# Patient Record
Sex: Female | Born: 2005 | Hispanic: No | Marital: Single | Smoking: Never smoker
Health system: Southern US, Community
[De-identification: ages and names within clinical notes are randomized; demographics above are authoritative.]

## PROBLEM LIST (undated history)

## (undated) ENCOUNTER — Ambulatory Visit: Admission: EM

---
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
## 2023-01-06 DIAGNOSIS — Z113 Encounter for screening for infections with a predominantly sexual mode of transmission: Secondary | ICD-10-CM | POA: Diagnosis not present

## 2023-04-29 DIAGNOSIS — F4323 Adjustment disorder with mixed anxiety and depressed mood: Secondary | ICD-10-CM | POA: Diagnosis not present

## 2023-05-06 DIAGNOSIS — F4323 Adjustment disorder with mixed anxiety and depressed mood: Secondary | ICD-10-CM | POA: Diagnosis not present

## 2023-05-16 DIAGNOSIS — F4323 Adjustment disorder with mixed anxiety and depressed mood: Secondary | ICD-10-CM | POA: Diagnosis not present

## 2023-05-30 DIAGNOSIS — F4323 Adjustment disorder with mixed anxiety and depressed mood: Secondary | ICD-10-CM | POA: Diagnosis not present

## 2023-06-13 DIAGNOSIS — F4323 Adjustment disorder with mixed anxiety and depressed mood: Secondary | ICD-10-CM | POA: Diagnosis not present

## 2023-06-27 DIAGNOSIS — F4323 Adjustment disorder with mixed anxiety and depressed mood: Secondary | ICD-10-CM | POA: Diagnosis not present

## 2023-07-07 ENCOUNTER — Ambulatory Visit
Admission: EM | Admit: 2023-07-07 | Discharge: 2023-07-07 | Disposition: A | Discharge status: Home / Self Care | Attending: Nurse Practitioner | Admitting: Nurse Practitioner

## 2023-07-07 DIAGNOSIS — R600 Localized edema: Secondary | ICD-10-CM | POA: Diagnosis not present

## 2023-07-07 MED ORDER — AMOXICILLIN-POT CLAVULANATE 875-125 MG PO TABS: 1.0000 | ORAL_TABLET | Freq: Two times a day (BID) | ORAL | 0 refills | Status: AC

## 2023-07-07 MED ORDER — PREDNISONE 20 MG PO TABS: 40.0000 mg | ORAL_TABLET | Freq: Every day | ORAL | 0 refills | Status: AC

## 2023-07-07 NOTE — ED Triage Notes (Signed)
"  Starting Friday with skin underneath my eyes swollen, red, itchy, tight". "I have been using the same stuff I have been using for months now, so not sure wheat it is from". "It isn't my eyes, just the skin below".

## 2023-07-07 NOTE — ED Provider Notes (Signed)
[facility]    CSN: [id number] Arrival date & time: 07/07/23  1740      History   Chief Complaint Chief Complaint  Patient presents with   Skin Problem    HPI Felicia Chang is a 18 y.o. female.   Felicia Chang is a 19 year old female who presents with redness and swelling of the skin just below both eyes, with symptom onset approximately three days ago. She describes the affected areas as itchy but denies any pain. There is no involvement of the eyes themselves-no eye pain, redness, swelling, or visual disturbances. She has not experienced fever, recent illness, headache, pain with eye movement, or neck stiffness. The patient wears both glasses and contact lenses but admits to not wearing them consistently; she has recently been using her glasses and has not worn her contacts in some time. She denies any trauma to the eyes or surrounding areas. Although she uses makeup intermittently, she has not changed or introduced any new facial products. She applied hydrocortisone cream to the affected areas without improvement.  The following portions of the patient's history were reviewed and updated as appropriate: allergies, current medications, past family history, past medical history, past social history, past surgical history, and problem list. proptosis, restricted eye movement, or vision changes.          Past Medical History:  Diagnosis Date   Asthma     Patient Active Problem List   Diagnosis Date Noted   Adolescent idiopathic scoliosis of thoracolumbar region 11/20/2021   Low back pain 11/20/2021    History reviewed. No pertinent surgical history.  OB History   No obstetric history on file.      Home Medications    Prior to Admission medications   Medication Sig Start Date End Date Taking? Authorizing Provider  amoxicillin -clavulanate (AUGMENTIN ) 875-125 MG tablet Take 1 tablet by mouth every 12 (twelve) hours. 07/07/23  Yes Tyner Codner, FNP   predniSONE  (DELTASONE ) 20 MG tablet Take 2 tablets (40 mg total) by mouth daily for 5 days. 07/07/23 07/12/23 Yes Maryruth Sol, FNP    Family History History reviewed. No pertinent family history.  Social History Social History   Tobacco Use   Smoking status: Never   Smokeless tobacco: Never  Vaping Use   Vaping status: Former   Substances: Nicotine, Flavoring  Substance Use Topics   Alcohol use: No   Drug use: No     Allergies   Patient has no known allergies.   Review of Systems Review of Systems  Constitutional:  Negative for fatigue and fever.  HENT: Negative.    Eyes:  Negative for photophobia, pain, discharge, redness, itching and visual disturbance.  Musculoskeletal:  Negative for myalgias, neck pain and neck stiffness.  Skin:  Negative for wound.  Neurological:  Negative for dizziness and headaches.  All other systems reviewed and are negative.    Physical Exam Triage Vital Signs ED Triage Vitals  Encounter Vitals Group     BP 07/07/23 1841 100/61     Systolic BP Percentile --      Diastolic BP Percentile --      Pulse Rate 07/07/23 1841 62     Resp 07/07/23 1841 18     Temp 07/07/23 1841 98.2 F (36.8 C)     Temp Source 07/07/23 1841 Oral     SpO2 07/07/23 1841 97 %     Weight 07/07/23 1838 115 lb (52.2 kg)     Height 07/07/23 1838 5'  2" (1.575 m)     Head Circumference --      Peak Flow --      Pain Score 07/07/23 1838 1     Pain Loc --      Pain Education --      Exclude from Growth Chart --    No data found.  Updated Vital Signs BP 100/61 (BP Location: Left Arm)   Pulse 62   Temp 98.2 F (36.8 C) (Oral)   Resp 18   Ht 5\' 2"  (1.575 m)   Wt 115 lb (52.2 kg)   LMP 06/06/2023 (Exact Date)   SpO2 97%   BMI 21.03 kg/m   Visual Acuity Right Eye Distance:   Left Eye Distance:   Bilateral Distance:    Right Eye Near:   Left Eye Near:    Bilateral Near:     Physical Exam Vitals reviewed.  Constitutional:      General: She is  not in acute distress.    Appearance: Normal appearance. She is not toxic-appearing.  HENT:     Head: Normocephalic.     Nose: Nose normal.     Mouth/Throat:     Mouth: Mucous membranes are moist.  Eyes:     General: Lids are normal. Lids are everted, no foreign bodies appreciated. Vision grossly intact. Gaze aligned appropriately. No visual field deficit.       Right eye: No discharge.        Left eye: No discharge.     Extraocular Movements: Extraocular movements intact.     Right eye: Normal extraocular motion and no nystagmus.     Left eye: Normal extraocular motion and no nystagmus.     Conjunctiva/sclera: Conjunctivae normal.     Right eye: Right conjunctiva is not injected.     Left eye: Left conjunctiva is not injected.     Pupils: Pupils are equal, round, and reactive to light.     Visual Fields: Right eye visual fields normal and left eye visual fields normal.     Comments: Erythema and mild swelling noted to the infraorbital areas bilaterally. No open lesions, excoriations, or signs of trauma. Eyelids are not swollen. Conjunctivae are clear without injection or discharge. No abnormalities noted within the eyes themselves. Vision is grossly intact.  Cardiovascular:     Rate and Rhythm: Normal rate and regular rhythm.     Heart sounds: Normal heart sounds.  Pulmonary:     Effort: Pulmonary effort is normal.     Breath sounds: Normal breath sounds.  Musculoskeletal:        General: Normal range of motion.     Cervical back: Full passive range of motion without pain, normal range of motion and neck supple.  Lymphadenopathy:     Cervical: No cervical adenopathy.  Skin:    General: Skin is warm and dry.  Neurological:     General: No focal deficit present.     Mental Status: She is alert and oriented to person, place, and time.      UC Treatments / Results  Labs (all labs ordered are listed, but only abnormal results are displayed) Labs Reviewed - No data to  display  EKG   Radiology No results found.  Procedures Procedures (including critical care time)  Medications Ordered in UC Medications - No data to display  Initial Impression / Assessment and Plan / UC Course  I have reviewed the triage vital signs and the nursing notes.  Pertinent labs &  imaging results that were available during my care of the patient were reviewed by me and considered in my medical decision making (see chart for details).    18 year old female presents with a three-day history of erythema and swelling localized to the bilateral infraorbital areas. She denies eye pain, redness, swelling, discharge, visual disturbances, or systemic symptoms such as fever or malaise. On exam, patient is alert and oriented, non-toxic in appearance, with grossly intact vision. Conjunctivae are clear without injection or discharge. Eyelids are not swollen. The infraorbital regions are erythematous and swollen without open lesions, excoriations, or signs of trauma. No abnormalities noted within the eyes themselves.  The etiology of the patient's symptoms is unclear. There is no history of recent trauma, illness, or new topical exposures. Findings are isolated to the infraorbital areas bilaterally with no other skin involvement. While periorbital cellulitis remains in the differential, it is considered less likely given the bilateral distribution, lack of systemic symptoms, and absence of eyelid involvement. Contact dermatitis remains a consideration despite the patient denying any known new exposures.  Empiric treatment was initiated with Augmentin  twice daily and a short course of prednisone , each for five days. Supportive measures such as warm compresses were also advised. The patient was counseled to avoid contact lens use and refrain from applying eye makeup until symptoms resolve. Close monitoring of symptoms was emphasized, and strict return precautions were reviewed. Follow-up with PCP  or return to urgent care in 48 hours if no improvement noted in symptoms.   The patient and her mother were counseled extensively on the diagnosis, treatment plan, and potential red flag symptoms. They verbalized understanding and are comfortable with the plan. Discharge instructions were provided both verbally and in writing. The patient is currently stable, with intact mental status and judgment appropriate for shared medical decision-making.  Documentation was completed with the aid of voice recognition software. Transcription may contain typographical errors. Final Clinical Impressions(s) / UC Diagnoses   Final diagnoses:  Periorbital edema of both eyes     Discharge Instructions      You were seen today for redness and swelling below both of your eyes, without any pain or redness in the eyes themselves. You have not had any recent injury or illness, and the exact cause of your symptoms is unclear at this time. Because both eyes are affected, it is less likely to be due to an infection.Take all medications exactly as prescribed, even if your symptoms start to improve. Apply warm compresses to the affected areas several times a day to help with the swelling and discomfort. Always wash your hands before touching your face or eyes. Do not wear contact lenses or use any eye makeup until your symptoms have completely resolved.  Keep a close watch on your symptoms. If you notice increased redness, swelling, pain, changes in vision, or if it becomes difficult to open your eyes, seek medical care right away.  Follow up with your primary care provider or return to the clinic in 48 hours if you do not see any improvement.     ED Prescriptions     Medication Sig Dispense Auth. Provider   amoxicillin -clavulanate (AUGMENTIN ) 875-125 MG tablet Take 1 tablet by mouth every 12 (twelve) hours. 10 tablet Maryruth Sol, FNP   predniSONE  (DELTASONE ) 20 MG tablet Take 2 tablets (40 mg total) by mouth  daily for 5 days. 10 tablet Maryruth Sol, FNP      PDMP not reviewed this encounter.   Maryruth Sol, FNP  07/08/23 0824  

## 2023-07-07 NOTE — Discharge Instructions (Addendum)
 You were seen today for redness and swelling below both of your eyes, without any pain or redness in the eyes themselves. You have not had any recent injury or illness, and the exact cause of your symptoms is unclear at this time. Because both eyes are affected, it is less likely to be due to an infection.Take all medications exactly as prescribed, even if your symptoms start to improve. Apply warm compresses to the affected areas several times a day to help with the swelling and discomfort. Always wash your hands before touching your face or eyes. Do not wear contact lenses or use any eye makeup until your symptoms have completely resolved.  Keep a close watch on your symptoms. If you notice increased redness, swelling, pain, changes in vision, or if it becomes difficult to open your eyes, seek medical care right away.  Follow up with your primary care provider or return to the clinic in 48 hours if you do not see any improvement.

## 2023-07-10 DIAGNOSIS — F4323 Adjustment disorder with mixed anxiety and depressed mood: Secondary | ICD-10-CM | POA: Diagnosis not present

## 2023-07-25 DIAGNOSIS — F4323 Adjustment disorder with mixed anxiety and depressed mood: Secondary | ICD-10-CM | POA: Diagnosis not present

## 2023-08-03 ENCOUNTER — Encounter: Payer: Self-pay | Admitting: *Deleted

## 2023-08-03 ENCOUNTER — Ambulatory Visit
Admission: EM | Admit: 2023-08-03 | Discharge: 2023-08-03 | Disposition: A | Discharge status: Home / Self Care | Attending: Internal Medicine | Admitting: Internal Medicine

## 2023-08-03 DIAGNOSIS — S80211A Abrasion, right knee, initial encounter: Secondary | ICD-10-CM | POA: Diagnosis not present

## 2023-08-03 DIAGNOSIS — S50312A Abrasion of left elbow, initial encounter: Secondary | ICD-10-CM | POA: Diagnosis not present

## 2023-08-03 DIAGNOSIS — T148XXA Other injury of unspecified body region, initial encounter: Secondary | ICD-10-CM | POA: Diagnosis not present

## 2023-08-03 DIAGNOSIS — S90412A Abrasion, left great toe, initial encounter: Secondary | ICD-10-CM

## 2023-08-03 DIAGNOSIS — S50311A Abrasion of right elbow, initial encounter: Secondary | ICD-10-CM | POA: Diagnosis not present

## 2023-08-03 DIAGNOSIS — Z23 Encounter for immunization: Secondary | ICD-10-CM

## 2023-08-03 DIAGNOSIS — L089 Local infection of the skin and subcutaneous tissue, unspecified: Secondary | ICD-10-CM

## 2023-08-03 MED ORDER — MUPIROCIN 2 % EX OINT: 1.0000 | TOPICAL_OINTMENT | Freq: Two times a day (BID) | CUTANEOUS | 1 refills | Status: AC

## 2023-08-03 MED ORDER — TETANUS-DIPHTH-ACELL PERTUSSIS 5-2.5-18.5 LF-MCG/0.5 IM SUSY
0.5000 mL | PREFILLED_SYRINGE | Freq: Once | INTRAMUSCULAR | Status: AC
  Administered 2023-08-03: 0.5 mL via INTRAMUSCULAR

## 2023-08-03 MED ORDER — DOXYCYCLINE HYCLATE 100 MG PO CAPS: 100.0000 mg | ORAL_CAPSULE | Freq: Two times a day (BID) | ORAL | 0 refills | Status: AC

## 2023-08-03 NOTE — ED Provider Notes (Signed)
 [facility]    CSN: [id number] Arrival date & time: 08/03/23  1347      History   Chief Complaint Chief Complaint  Patient presents with   Wound Check    HPI Felicia Chang is a 18 y.o. female.   18 year old female who presents urgent care with complaints of abrasions on both elbows, right knee and left great toe.  [place] and fell.  She thinks she may have hit a nail with her toe.  The areas are very tender and she was concerned they might be infected.  She is accompanied by her parents.  She denies any fevers or chills.  Her parents are unsure when her last tetanus was but feels that it is probably been over 10 years.  She has been putting Neosporin on the areas.   Wound Check Pertinent negatives include no chest pain, no abdominal pain and no shortness of breath.    Past Medical History:  Diagnosis Date   Asthma     Patient Active Problem List   Diagnosis Date Noted   Adolescent idiopathic scoliosis of thoracolumbar region 11/20/2021   Low back pain 11/20/2021    History reviewed. No pertinent surgical history.  OB History   No obstetric history on file.      Home Medications    Prior to Admission medications   Medication Sig Start Date End Date Taking? Authorizing Provider  doxycycline (VIBRAMYCIN) 100 MG capsule Take 1 capsule (100 mg total) by mouth 2 (two) times daily for 7 days. 08/03/23 08/10/23 Yes Khamryn Calderone A, PA-C  mupirocin ointment (BACTROBAN) 2 % Apply 1 Application topically 2 (two) times daily. 08/03/23  Yes Shailey Butterbaugh A, PA-C  amoxicillin -clavulanate (AUGMENTIN ) 875-125 MG tablet Take 1 tablet by mouth every 12 (twelve) hours. Patient not taking: Reported on 08/03/2023 07/07/23   Maryruth Sol, FNP    Family History History reviewed. No pertinent family history.  Social History Social History   Tobacco Use   Smoking status: Never   Smokeless tobacco:  Never  Vaping Use   Vaping status: Some Days   Substances: Nicotine, Flavoring  Substance Use Topics   Alcohol use: Yes   Drug use: No     Allergies   Patient has no known allergies.   Review of Systems Review of Systems  Constitutional:  Negative for chills and fever.  HENT:  Negative for ear pain and sore throat.   Eyes:  Negative for pain and visual disturbance.  Respiratory:  Negative for cough and shortness of breath.   Cardiovascular:  Negative for chest pain and palpitations.  Gastrointestinal:  Negative for abdominal pain and vomiting.  Genitourinary:  Negative for dysuria and hematuria.  Musculoskeletal:  Negative for arthralgias and back pain.  Skin:  Positive for wound. Negative for color change and rash.  Neurological:  Negative for seizures and syncope.  All other systems reviewed and are negative.    Physical Exam Triage Vital Signs ED Triage Vitals  Encounter Vitals Group     BP 08/03/23 1448 103/66     Systolic BP Percentile --      Diastolic BP Percentile --      Pulse Rate 08/03/23 1448 74     Resp 08/03/23 1448 16     Temp 08/03/23 1448 98.4 F (36.9 C)     Temp Source 08/03/23 1448 Oral     SpO2 08/03/23 1448 97 %  Weight --      Height --      Head Circumference --      Peak Flow --      Pain Score 08/03/23 1446 4     Pain Loc --      Pain Education --      Exclude from Growth Chart --    No data found.  Updated Vital Signs BP 103/66 (BP Location: Left Arm)   Pulse 74   Temp 98.4 F (36.9 C) (Oral)   Resp 16   LMP 07/17/2023 (Approximate)   SpO2 97%   Visual Acuity Right Eye Distance:   Left Eye Distance:   Bilateral Distance:    Right Eye Near:   Left Eye Near:    Bilateral Near:     Physical Exam Vitals and nursing note reviewed.  Constitutional:      General: She is not in acute distress.    Appearance: She is well-developed.  HENT:     Head: Normocephalic and atraumatic.     Mouth/Throat:     Mouth: Mucous  membranes are moist.  Eyes:     Conjunctiva/sclera: Conjunctivae normal.  Cardiovascular:     Rate and Rhythm: Normal rate and regular rhythm.     Heart sounds: No murmur heard. Pulmonary:     Effort: Pulmonary effort is normal. No respiratory distress.     Breath sounds: Normal breath sounds.  Abdominal:     Palpations: Abdomen is soft.     Tenderness: There is no abdominal tenderness.  Musculoskeletal:        General: No swelling.     Cervical back: Neck supple.  Skin:    General: Skin is warm and dry.     Capillary Refill: Capillary refill takes less than 2 seconds.       Neurological:     General: No focal deficit present.     Mental Status: She is alert.  Psychiatric:        Mood and Affect: Mood normal.      UC Treatments / Results  Labs (all labs ordered are listed, but only abnormal results are displayed) Labs Reviewed - No data to display  EKG   Radiology No results found.  Procedures Procedures (including critical care time)  Medications Ordered in UC Medications  Tdap (BOOSTRIX) injection 0.5 mL (0.5 mLs Intramuscular Given 08/03/23 1612)    Initial Impression / Assessment and Plan / UC Course  I have reviewed the triage vital signs and the nursing notes.  Pertinent labs & imaging results that were available during my care of the patient were reviewed by me and considered in my medical decision making (see chart for details).     Abrasion of right knee, initial encounter  Abrasion of left elbow, initial encounter  Abrasion of right elbow, initial encounter  Abrasion, left great toe, initial encounter  Wound infection   Abrasions on the right knee, bilateral elbows and end of the left great toe are healthy and healing appropriately.  Can paint these with mupirocin twice daily and leave open to air.  The wound on the side of the left great toe does have a small amount of purulence and feel that this wound would benefit from treatment with both  topical and antibiotics by mouth.  As we are not able to identify the last tetanus, we will update this today. Doxycycline 100 mg twice daily for 7 days. Take this with food.  Mupirocin to the affected areas  twice daily. May leave the knee and arms open to air but cover the left great toe with a dry dressing and change twice daily. Tetanus is updated today.  Wash the wounds with soap and water at least once daily May discontinue dressings once the areas are completely healed Return to urgent care or PCP if symptoms worsen or fail to resolve.    Final Clinical Impressions(s) / UC Diagnoses   Final diagnoses:  Abrasion of right knee, initial encounter  Abrasion of left elbow, initial encounter  Abrasion of right elbow, initial encounter  Abrasion, left great toe, initial encounter  Wound infection     Discharge Instructions      Abrasions on the right knee, bilateral elbows and end of the left great toe are healthy and healing appropriately.  Can paint these with mupirocin twice daily and leave open to air.  The wound on the side of the left great toe does have a small amount of purulence and feel that this wound would benefit from treatment with both topical and antibiotics by mouth.  As we are not able to identify the last tetanus, we will update this today. Doxycycline 100 mg twice daily for 7 days. Take this with food.  Mupirocin to the affected areas twice daily. May leave the knee and arms open to air but cover the left great toe with a dry dressing and change twice daily. Tetanus is updated today.  Wash the wounds with soap and water at least once daily May discontinue dressings once the areas are completely healed Return to urgent care or PCP if symptoms worsen or fail to resolve.    ED Prescriptions     Medication Sig Dispense Auth. Provider   mupirocin ointment (BACTROBAN) 2 % Apply 1 Application topically 2 (two) times daily. 22 g Cassadee Vanzandt A, PA-C   doxycycline  (VIBRAMYCIN) 100 MG capsule Take 1 capsule (100 mg total) by mouth 2 (two) times daily for 7 days. 14 capsule Kreg Pesa, [place]      PDMP not reviewed this encounter.   Kreg Pesa, [place] 08/03/23 1644

## 2023-08-03 NOTE — Discharge Instructions (Addendum)
 Abrasions on the right knee, bilateral elbows and end of the left great toe are healthy and healing appropriately.  Can paint these with mupirocin twice daily and leave open to air.  The wound on the side of the left great toe does have a small amount of purulence and feel that this wound would benefit from treatment with both topical and antibiotics by mouth.  As we are not able to identify the last tetanus, we will update this today. Doxycycline 100 mg twice daily for 7 days. Take this with food.  Mupirocin to the affected areas twice daily. May leave the knee and arms open to air but cover the left great toe with a dry dressing and change twice daily. Tetanus is updated today.  Wash the wounds with soap and water at least once daily May discontinue dressings once the areas are completely healed Return to urgent care or PCP if symptoms worsen or fail to resolve.

## 2023-08-03 NOTE — ED Triage Notes (Signed)
 States fell on wooden beach access on Friday. Abrasion noted to right knee, both elbows, left thumb, and left great toe. Voices concern for infection in wounds

## 2023-08-08 DIAGNOSIS — F4323 Adjustment disorder with mixed anxiety and depressed mood: Secondary | ICD-10-CM | POA: Diagnosis not present

## 2023-08-22 DIAGNOSIS — F4323 Adjustment disorder with mixed anxiety and depressed mood: Secondary | ICD-10-CM | POA: Diagnosis not present

## 2023-09-05 DIAGNOSIS — F4323 Adjustment disorder with mixed anxiety and depressed mood: Secondary | ICD-10-CM | POA: Diagnosis not present

## 2023-09-19 DIAGNOSIS — F4323 Adjustment disorder with mixed anxiety and depressed mood: Secondary | ICD-10-CM | POA: Diagnosis not present

## 2023-10-31 DIAGNOSIS — F4323 Adjustment disorder with mixed anxiety and depressed mood: Secondary | ICD-10-CM | POA: Diagnosis not present

## 2023-11-28 DIAGNOSIS — F4323 Adjustment disorder with mixed anxiety and depressed mood: Secondary | ICD-10-CM | POA: Diagnosis not present

## 2023-12-23 ENCOUNTER — Ambulatory Visit

## 2024-01-18 ENCOUNTER — Ambulatory Visit: Admission: EM | Admit: 2024-01-18 | Discharge: 2024-01-18 | Disposition: A | Discharge status: Home / Self Care

## 2024-01-18 ENCOUNTER — Encounter: Payer: Self-pay | Admitting: *Deleted

## 2024-01-18 ENCOUNTER — Ambulatory Visit (HOSPITAL_COMMUNITY): Payer: Self-pay

## 2024-01-18 DIAGNOSIS — B3731 Acute candidiasis of vulva and vagina: Secondary | ICD-10-CM | POA: Diagnosis not present

## 2024-01-18 DIAGNOSIS — J309 Allergic rhinitis, unspecified: Secondary | ICD-10-CM | POA: Diagnosis not present

## 2024-01-18 DIAGNOSIS — Z202 Contact with and (suspected) exposure to infections with a predominantly sexual mode of transmission: Secondary | ICD-10-CM | POA: Diagnosis not present

## 2024-01-18 LAB — POCT URINE PREGNANCY: Preg Test, Ur: NEGATIVE

## 2024-01-18 MED ORDER — FLUCONAZOLE 150 MG PO TABS: ORAL_TABLET | ORAL | 1 refills | Status: AC

## 2024-01-18 MED ORDER — FEXOFENADINE HCL 180 MG PO TABS: 180.0000 mg | ORAL_TABLET | Freq: Every day | ORAL | 0 refills | Status: AC

## 2024-01-18 NOTE — ED Provider Notes (Signed)
 [facility]    CSN: [id number] Arrival date & time: 01/18/24  1517      History   Chief Complaint Chief Complaint  Patient presents with   Vaginal Discharge   Nasal Congestion    HPI Felicia Chang is a 18 y.o. female.   Patient presents today due to 3 weeks worth of cough, scratchy throat, clear nasal drainage, and sneezing.  Patient denies itchy watery eyes or known sick contacts.  Patient denies that it is affecting appetite or activity levels.  Patient also states that she has been experiencing 2 days worth of thick, white vaginal discharge.  Patient states she has had this in the past and believes that she has yeast infection.  Patient denies concern for STIs, but would like to be tested for peace of mind.  Patient denies malodorous discharge or lower abdominal pain.  The history is provided by the patient.  Vaginal Discharge   Past Medical History:  Diagnosis Date   Asthma     Patient Active Problem List   Diagnosis Date Noted   Adolescent idiopathic scoliosis of thoracolumbar region 11/20/2021   Low back pain 11/20/2021    History reviewed. No pertinent surgical history.  OB History   No obstetric history on file.      Home Medications    Prior to Admission medications   Medication Sig Start Date End Date Taking? Authorizing Provider  fexofenadine (ALLEGRA ALLERGY) 180 MG tablet Take 1 tablet (180 mg total) by mouth daily. 01/18/24  Yes Andra Krabbe C, PA-C  fluconazole (DIFLUCAN) 150 MG tablet Take 1 tab mouth every 3 days. 01/18/24  Yes Andra Krabbe C, PA-C  amoxicillin -clavulanate (AUGMENTIN ) 875-125 MG tablet Take 1 tablet by mouth every 12 (twelve) hours. 07/07/23   Iola Lukes, FNP  mupirocin  ointment (BACTROBAN ) 2 % Apply 1 Application topically 2 (two) times daily. Patient not taking: Reported on 01/18/2024 08/03/23   Teresa Almarie DELENA DEVONNA    Family History No family history on file.  Social History Social  History   Tobacco Use   Smoking status: Never   Smokeless tobacco: Never  Vaping Use   Vaping status: Some Days   Substances: Nicotine, Flavoring  Substance Use Topics   Alcohol use: Yes   Drug use: No     Allergies   Patient has no known allergies.   Review of Systems Review of Systems  Genitourinary:  Positive for vaginal discharge.     Physical Exam Triage Vital Signs ED Triage Vitals  Encounter Vitals Group     BP 01/18/24 1532 114/72     Girls Systolic BP Percentile --      Girls Diastolic BP Percentile --      Boys Systolic BP Percentile --      Boys Diastolic BP Percentile --      Pulse Rate 01/18/24 1532 85     Resp 01/18/24 1532 16     Temp 01/18/24 1532 98.2 F (36.8 C)     Temp Source 01/18/24 1532 Oral     SpO2 01/18/24 1532 98 %     Weight --      Height --      Head Circumference --      Peak Flow --      Pain Score 01/18/24 1531 0     Pain Loc --      Pain Education --      Exclude from Growth Chart --    No data found.  Updated Vital Signs BP 114/72 (BP Location: Left Arm)   Pulse 85   Temp 98.2 F (36.8 C) (Oral)   Resp 16   LMP 12/20/2023 (Approximate)   SpO2 98%   Visual Acuity Right Eye Distance:   Left Eye Distance:   Bilateral Distance:    Right Eye Near:   Left Eye Near:    Bilateral Near:     Physical Exam Vitals and nursing note reviewed.  Constitutional:      General: She is not in acute distress.    Appearance: Normal appearance. She is not ill-appearing, toxic-appearing or diaphoretic.  Eyes:     General: No scleral icterus. Cardiovascular:     Rate and Rhythm: Normal rate and regular rhythm.     Heart sounds: Normal heart sounds.  Pulmonary:     Effort: Pulmonary effort is normal. No respiratory distress.     Breath sounds: Normal breath sounds. No wheezing or rhonchi.  Abdominal:     General: Abdomen is flat. Bowel sounds are normal.     Palpations: Abdomen is soft.     Tenderness: There is no abdominal  tenderness. There is left CVA tenderness. There is no right CVA tenderness.  Skin:    General: Skin is warm.  Neurological:     Mental Status: She is alert and oriented to person, place, and time.  Psychiatric:        Mood and Affect: Mood normal.        Behavior: Behavior normal.      UC Treatments / Results  Labs (all labs ordered are listed, but only abnormal results are displayed) Labs Reviewed  POCT URINE PREGNANCY - Normal  CERVICOVAGINAL ANCILLARY ONLY    EKG   Radiology No results found.  Procedures Procedures (including critical care time)  Medications Ordered in UC Medications - No data to display  Initial Impression / Assessment and Plan / UC Course  I have reviewed the triage vital signs and the nursing notes.  Pertinent labs & imaging results that were available during my care of the patient were reviewed by me and considered in my medical decision making (see chart for details).    Final Clinical Impressions(s) / UC Diagnoses   Final diagnoses:  Contact with and (suspected) exposure to infections with a predominantly sexual mode of transmission  Allergic rhinitis, unspecified seasonality, unspecified trigger  Vaginal candida   Discharge Instructions   None    ED Prescriptions     Medication Sig Dispense Auth. Provider   fexofenadine (ALLEGRA ALLERGY) 180 MG tablet Take 1 tablet (180 mg total) by mouth daily. 30 tablet Andra Krabbe C, PA-C   fluconazole (DIFLUCAN) 150 MG tablet Take 1 tab mouth every 3 days. 2 tablet Andra Krabbe BROCKS, PA-C      PDMP not reviewed this encounter.   Andra Krabbe BROCKS, PA-C 01/18/24 1555

## 2024-01-18 NOTE — ED Triage Notes (Signed)
 Pt reports nasal congestion and throat pain onset 3 weeks ago. States her throat is better but still having nasal congestion.   Also has vaginal itching x 2 days with white clumpy discharge

## 2024-01-19 ENCOUNTER — Ambulatory Visit (HOSPITAL_COMMUNITY): Payer: Self-pay

## 2024-01-19 LAB — CERVICOVAGINAL ANCILLARY ONLY
Bacterial Vaginitis (gardnerella): NEGATIVE
Candida Glabrata: NEGATIVE
Candida Vaginitis: POSITIVE — AB
Chlamydia: NEGATIVE
Comment: NEGATIVE
Comment: NEGATIVE
Comment: NEGATIVE
Comment: NEGATIVE
Comment: NEGATIVE
Comment: NORMAL
Neisseria Gonorrhea: NEGATIVE
Trichomonas: NEGATIVE
